# Patient Record
Sex: Female | Born: 1945 | Race: Black or African American | Hispanic: No | Marital: Married | Smoking: Never smoker
Health system: Southern US, Community
[De-identification: ages and names within clinical notes are randomized; demographics above are authoritative.]

## PROBLEM LIST (undated history)

## (undated) HISTORY — PX: BREAST CYST EXCISION: SHX579

---

## 2014-02-09 ENCOUNTER — Other Ambulatory Visit: Payer: Self-pay

## 2014-02-09 DIAGNOSIS — Z1231 Encounter for screening mammogram for malignant neoplasm of breast: Secondary | ICD-10-CM

## 2014-02-24 ENCOUNTER — Ambulatory Visit
Admission: RE | Admit: 2014-02-24 | Discharge: 2014-02-24 | Disposition: A | Discharge status: Home / Self Care | Payer: Medicare Other | Source: Ambulatory Visit

## 2014-02-24 ENCOUNTER — Encounter (INDEPENDENT_AMBULATORY_CARE_PROVIDER_SITE_OTHER): Payer: Self-pay

## 2014-02-24 DIAGNOSIS — Z1231 Encounter for screening mammogram for malignant neoplasm of breast: Secondary | ICD-10-CM

## 2015-02-21 ENCOUNTER — Other Ambulatory Visit: Payer: Self-pay

## 2015-02-21 DIAGNOSIS — Z1231 Encounter for screening mammogram for malignant neoplasm of breast: Secondary | ICD-10-CM

## 2015-03-30 ENCOUNTER — Ambulatory Visit
Admission: RE | Admit: 2015-03-30 | Discharge: 2015-03-30 | Disposition: A | Discharge status: Home / Self Care | Payer: Medicare HMO | Source: Ambulatory Visit

## 2015-03-30 DIAGNOSIS — Z1231 Encounter for screening mammogram for malignant neoplasm of breast: Secondary | ICD-10-CM

## 2015-04-03 ENCOUNTER — Other Ambulatory Visit: Payer: Self-pay | Admitting: Family Medicine

## 2015-04-03 DIAGNOSIS — R928 Other abnormal and inconclusive findings on diagnostic imaging of breast: Secondary | ICD-10-CM

## 2015-04-21 ENCOUNTER — Ambulatory Visit
Admission: RE | Admit: 2015-04-21 | Discharge: 2015-04-21 | Disposition: A | Discharge status: Home / Self Care | Payer: Medicare HMO | Source: Ambulatory Visit | Attending: Family Medicine | Admitting: Family Medicine

## 2015-04-21 DIAGNOSIS — R928 Other abnormal and inconclusive findings on diagnostic imaging of breast: Secondary | ICD-10-CM

## 2015-07-14 ENCOUNTER — Ambulatory Visit: Payer: Medicare HMO | Admitting: Physical Therapy

## 2015-07-21 ENCOUNTER — Ambulatory Visit: Payer: Medicare HMO | Admitting: Physical Therapy

## 2015-08-24 ENCOUNTER — Ambulatory Visit: Payer: Medicare HMO | Attending: Family Medicine | Admitting: Physical Therapy

## 2015-08-24 ENCOUNTER — Encounter: Payer: Self-pay | Admitting: Physical Therapy

## 2015-08-24 DIAGNOSIS — M545 Low back pain, unspecified: Secondary | ICD-10-CM

## 2015-08-24 NOTE — Therapy (Addendum)
[place], [place], [address] Phone: [phone]   Fax:  [phone]  Physical Therapy Evaluation  Patient Details  Name: Melissa Hull MRN: [id number] Date of Birth: [birth year] Referring Provider: Suzanna Obey  Encounter Date: 08/24/2015      PT End of Session - 08/24/15 1326    Visit Number 1   Date for PT Re-Evaluation 10/24/15   PT Start Time 1301   PT Stop Time 1355   PT Time Calculation (min) 54 min   Activity Tolerance Patient tolerated treatment well   Behavior During Therapy [facility] for tasks assessed/performed      History reviewed. No pertinent past medical history.  History reviewed. No pertinent past surgical history.  There were no vitals filed for this visit.       Subjective Assessment - 08/24/15 1301    Subjective Patient reports that she has had a long standing LBP.  She reports that she has seen a chiropractor in the past with good results.  She reports that she has had some increase of LBP recently, no known cause.     Limitations Lifting;Sitting  limited sleeping   Patient Stated Goals have less pain   Currently in Pain? Yes   Pain Score 3    Pain Location Back   Pain Orientation Lower;Left   Pain Descriptors / Indicators Aching   Pain Type Chronic pain   Pain Onset More than a month ago   Pain Frequency Constant   Aggravating Factors  lifting, standing can increase pain up to 8/10   Pain Relieving Factors rest, Tylenol and Motrin   Effect of Pain on Daily Activities limits a lot of ADL's. reports that she just cannot do what she was able to do            [facility] PT Assessment - 08/24/15 0001    Assessment   Medical Diagnosis LBP   Referring Provider Suzanna Obey   Onset Date/Surgical Date 07/25/15   Prior Therapy chiropractor   Precautions   Precautions None   Balance Screen   Has the patient fallen in the past 6 months No   Has the patient had a  decrease in activity level because of a fear of falling?  No   Is the patient reluctant to leave their home because of a fear of falling?  No   Home Environment   Additional Comments does housework   Prior Function   Level of Independence Independent   Vocation Retired   Leisure does not exercise   Mining engineer Comments fwd head   ROM / Strength   AROM / PROM / Strength AROM;Strength   AROM   Overall AROM Comments Lumbar ROM decreased 25% with some "stiffness"   Strength   Overall Strength Comments 4-/5 for the LE's   Flexibility   Soft Tissue Assessment /Muscle Length --  tightness in the calves, HS and piriformis   Palpation   Palpation comment patient with tenderness and tightness in the lumbar paraspinals and into the buttocks                   [facility] Adult PT Treatment/Exercise - 08/24/15 0001    Modalities   Modalities Electrical Stimulation;Moist Heat   Moist Heat Therapy   Number Minutes Moist Heat 15 Minutes   Moist Heat Location Lumbar Spine   Electrical Stimulation   Electrical Stimulation Location low back  Electrical Stimulation Action IFC   Electrical Stimulation Parameters sitting   Electrical Stimulation Goals Pain                PT Education - 08/24/15 1326    Education provided Yes   Education Details Wms flexion exercises, HS and piriformis stretches   Person(s) Educated Patient   Methods Explanation;Demonstration;Handout   Comprehension Verbalized understanding          PT Short Term Goals - 08/24/15 1330    PT SHORT TERM GOAL #1   Title independent with intial HEP   Time 2   Period Weeks   Status New           PT Long Term Goals - 08/24/15 1330    PT LONG TERM GOAL #1   Title decrease pain 50%   Time 8   Period Weeks   Status New   PT LONG TERM GOAL #2   Title increase lumbar ROM 25%   Time 8   Period Weeks   Status New   PT LONG TERM GOAL #3   Title understand proper posture and body  mechanics   Time 8   Period Weeks   Status New   PT LONG TERM GOAL #4   Title increase LE strength to 4/5   Time 8   Period Weeks   Status New   PT LONG TERM GOAL #5   Title report sleep better per her by 25%   Time 8   Period Weeks   Status New               Plan - 08/24/15 1328    Clinical Impression Statement Patient with chronic LBP, had recent x-rays but has no results yet.  She reports that she has benefitted from chiropractic treatment in the past.  She is tight in the calves, HS and piriformis mms, her LE's are weak   Rehab Potential Good   PT Frequency 2x / week   PT Duration 8 weeks   PT Treatment/Interventions Moist Heat;Therapeutic exercise;Therapeutic activities;Ultrasound;Traction;ADLs/Self Care Home Management;Patient/family education;Manual techniques;Passive range of motion   PT Next Visit Plan slowly add exercises for core strength   Consulted and Agree with Plan of Care Patient      Patient will benefit from skilled therapeutic intervention in order to improve the following deficits and impairments:  Decreased range of motion, Decreased strength, Difficulty walking, Increased fascial restricitons, Increased muscle spasms, Impaired flexibility, Postural dysfunction, Improper body mechanics, Pain  Visit Diagnosis: Bilateral low back pain without sciatica - Plan: PT plan of care cert/re-cert      G-Codes - 78/29/56 1333    Functional Assessment Tool Used foto 54% limitation   Functional Limitation Other PT primary   Other PT Primary Current Status (O1308) At least 40 percent but less than 60 percent impaired, limited or restricted   Other PT Primary Goal Status (M5784) At least 40 percent but less than 60 percent impaired, limited or restricted     PHYSICAL THERAPY DISCHARGE SUMMARY  Visits from Start of Care: 1   Plan: Patient agrees to discharge.  Patient goals were not met. Patient is being discharged due to not returning since the last visit.   ?????        Problem List There are no active problems to display for this patient.   Sumner Boast., PT 08/24/2015, 1:43 PM  [place], [place], [address]  Phone: [phone]   Fax:  [phone]  Name: Melissa Hull MRN: [id number] Date of Birth: 05-12-1945

## 2015-08-31 ENCOUNTER — Ambulatory Visit: Payer: Medicare HMO | Admitting: Physical Therapy

## 2015-09-05 ENCOUNTER — Ambulatory Visit: Payer: Medicare HMO | Admitting: Physical Therapy

## 2015-09-12 ENCOUNTER — Other Ambulatory Visit: Payer: Self-pay | Admitting: Family Medicine

## 2015-09-12 DIAGNOSIS — N6310 Unspecified lump in the right breast, unspecified quadrant: Secondary | ICD-10-CM

## 2015-10-20 ENCOUNTER — Other Ambulatory Visit: Payer: Medicare HMO

## 2015-10-24 ENCOUNTER — Other Ambulatory Visit: Payer: Medicare HMO

## 2015-11-01 ENCOUNTER — Ambulatory Visit
Admission: RE | Admit: 2015-11-01 | Discharge: 2015-11-01 | Disposition: A | Discharge status: Home / Self Care | Payer: Medicare HMO | Source: Ambulatory Visit | Attending: Family Medicine | Admitting: Family Medicine

## 2015-11-01 DIAGNOSIS — N6310 Unspecified lump in the right breast, unspecified quadrant: Secondary | ICD-10-CM

## 2016-04-26 ENCOUNTER — Other Ambulatory Visit: Payer: Self-pay | Admitting: Family Medicine

## 2016-04-26 DIAGNOSIS — N6310 Unspecified lump in the right breast, unspecified quadrant: Secondary | ICD-10-CM

## 2016-08-22 ENCOUNTER — Ambulatory Visit: Payer: Medicare HMO | Admitting: Physical Therapy

## 2017-01-07 ENCOUNTER — Ambulatory Visit
Admission: RE | Admit: 2017-01-07 | Discharge: 2017-01-07 | Disposition: A | Discharge status: Home / Self Care | Payer: Medicare HMO | Source: Ambulatory Visit | Attending: Family Medicine | Admitting: Family Medicine

## 2017-01-07 DIAGNOSIS — N6310 Unspecified lump in the right breast, unspecified quadrant: Secondary | ICD-10-CM

## 2017-12-09 ENCOUNTER — Other Ambulatory Visit: Payer: Self-pay | Admitting: Family Medicine

## 2017-12-09 DIAGNOSIS — Z1231 Encounter for screening mammogram for malignant neoplasm of breast: Secondary | ICD-10-CM

## 2018-01-08 ENCOUNTER — Ambulatory Visit
Admission: RE | Admit: 2018-01-08 | Discharge: 2018-01-08 | Disposition: A | Discharge status: Home / Self Care | Payer: Medicare HMO | Source: Ambulatory Visit | Attending: Family Medicine | Admitting: Family Medicine

## 2018-01-08 DIAGNOSIS — Z1231 Encounter for screening mammogram for malignant neoplasm of breast: Secondary | ICD-10-CM

## 2019-04-06 ENCOUNTER — Other Ambulatory Visit: Payer: Self-pay | Admitting: Family Medicine

## 2019-04-06 DIAGNOSIS — Z1231 Encounter for screening mammogram for malignant neoplasm of breast: Secondary | ICD-10-CM

## 2019-05-06 ENCOUNTER — Ambulatory Visit: Payer: Medicare HMO | Attending: Internal Medicine

## 2019-05-06 DIAGNOSIS — Z23 Encounter for immunization: Secondary | ICD-10-CM | POA: Insufficient documentation

## 2019-05-06 NOTE — Progress Notes (Signed)
   Covid-19 Vaccination Clinic  Name:  VIKTORIA GRUETZMACHER    MRN: [id number] DOB: November 10, 1945  05/06/2019  Ms. Gigante was observed post Covid-19 immunization for 15 minutes without incidence. She was provided with Vaccine Information Sheet and instruction to access the V-Safe system.   Ms. Segoviano was instructed to call 911 with any severe reactions post vaccine: Marland Kitchen Difficulty breathing  . Swelling of your face and throat  . A fast heartbeat  . A bad rash all over your body  . Dizziness and weakness    Immunizations Administered    Name Date Dose VIS Date Route   Pfizer COVID-19 Vaccine 05/06/2019 10:26 AM 0.3 mL 03/26/2019 Intramuscular   Manufacturer: [employer], [employer]   Lot: [id number]   NDC: [address]-5

## 2019-05-21 ENCOUNTER — Other Ambulatory Visit: Payer: Self-pay

## 2019-05-21 ENCOUNTER — Ambulatory Visit
Admission: RE | Admit: 2019-05-21 | Discharge: 2019-05-21 | Disposition: A | Discharge status: Home / Self Care | Payer: Medicare HMO | Source: Ambulatory Visit | Attending: Family Medicine | Admitting: Family Medicine

## 2019-05-21 DIAGNOSIS — Z1231 Encounter for screening mammogram for malignant neoplasm of breast: Secondary | ICD-10-CM

## 2019-05-27 ENCOUNTER — Ambulatory Visit: Payer: Medicare HMO | Attending: Internal Medicine

## 2019-05-27 DIAGNOSIS — Z23 Encounter for immunization: Secondary | ICD-10-CM

## 2019-05-27 NOTE — Progress Notes (Signed)
   Covid-19 Vaccination Clinic  Name:  Melissa Hull    MRN: [id number] DOB: [birth year]  05/27/2019  [name] was observed post Covid-19 immunization for 15 minutes without incidence. She was provided with Vaccine Information Sheet and instruction to access the V-Safe system.   [name] was instructed to call 911 with any severe reactions post vaccine: Marland Kitchen Difficulty breathing  . Swelling of your face and throat  . A fast heartbeat  . A bad rash all over your body  . Dizziness and weakness    Immunizations Administered    Name Date Dose VIS Date Route   Pfizer COVID-19 Vaccine 05/27/2019 10:42 AM 0.3 mL 03/26/2019 Intramuscular   Manufacturer: [employer], [employer]   Lot: [id number]   NDC: [address]-5

## 2019-09-25 IMAGING — MG DIGITAL SCREENING BILATERAL MAMMOGRAM WITH TOMO AND CAD
8 of 14 series · 8 of 40 positions shown · non-contrast
Comparison: Previous exam(s).

CLINICAL DATA: Screening.

EXAM:
DIGITAL SCREENING BILATERAL MAMMOGRAM WITH TOMO AND CAD

[L MLO synth-2D (1 of 2)]
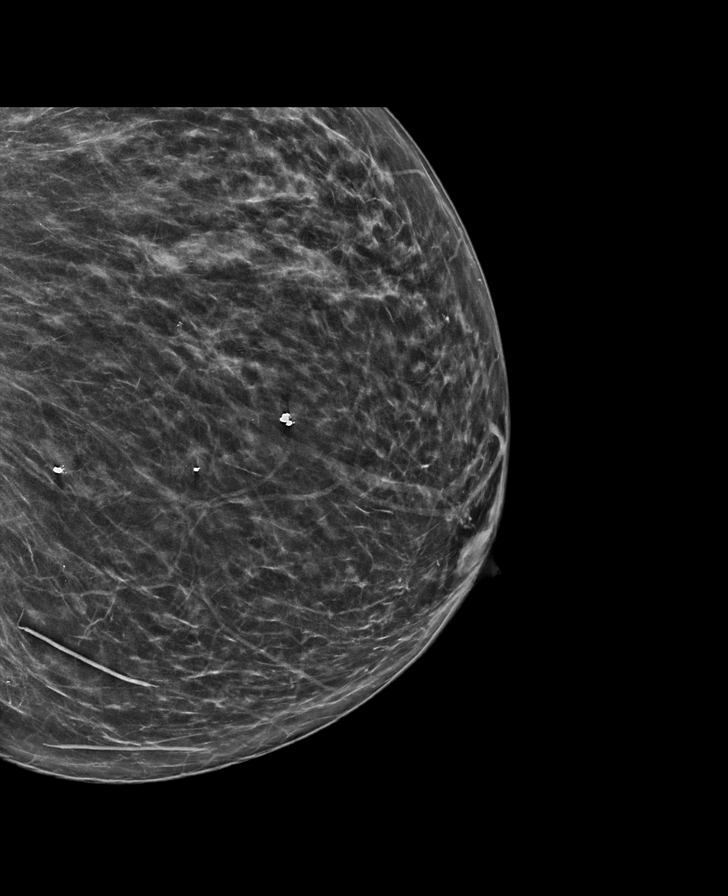

[R MLO synth-2D (1 of 2)]
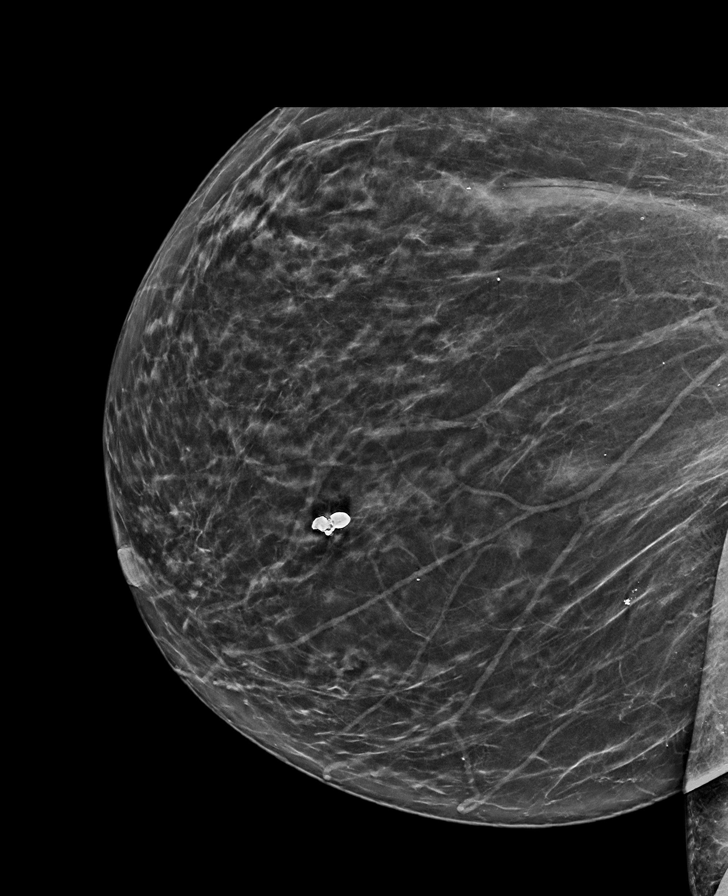

[R MLO synth-2D (2 of 2)]
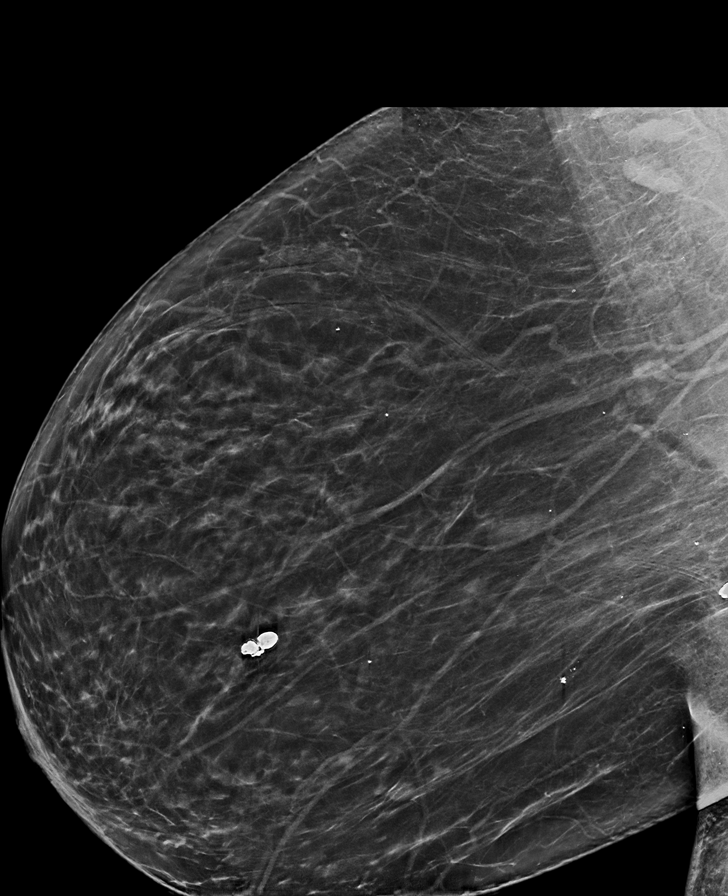

[L CC synth-2D (1 of 2)]
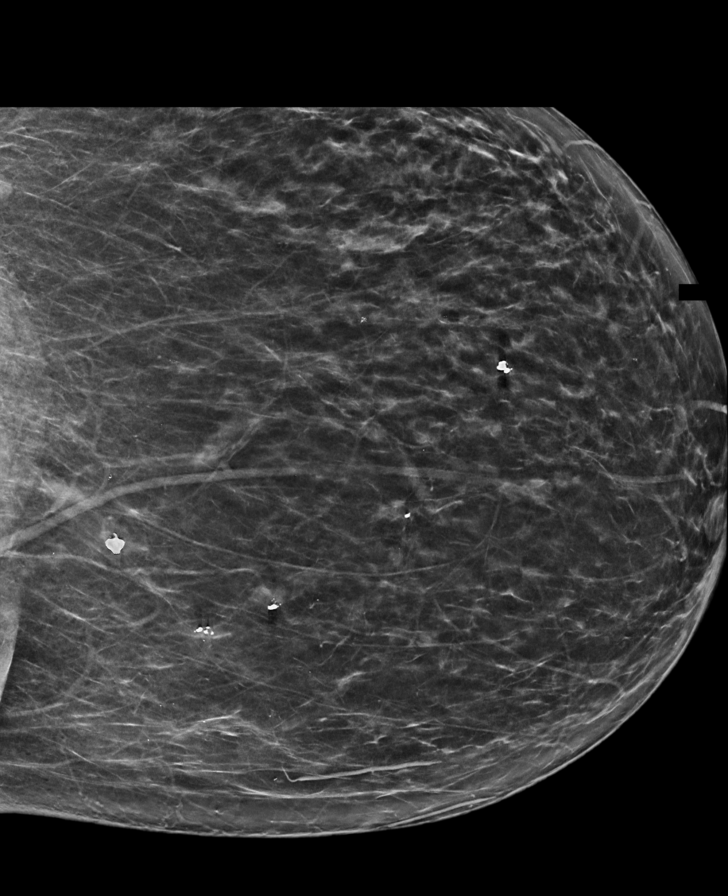

[L CC synth-2D (2 of 2)]
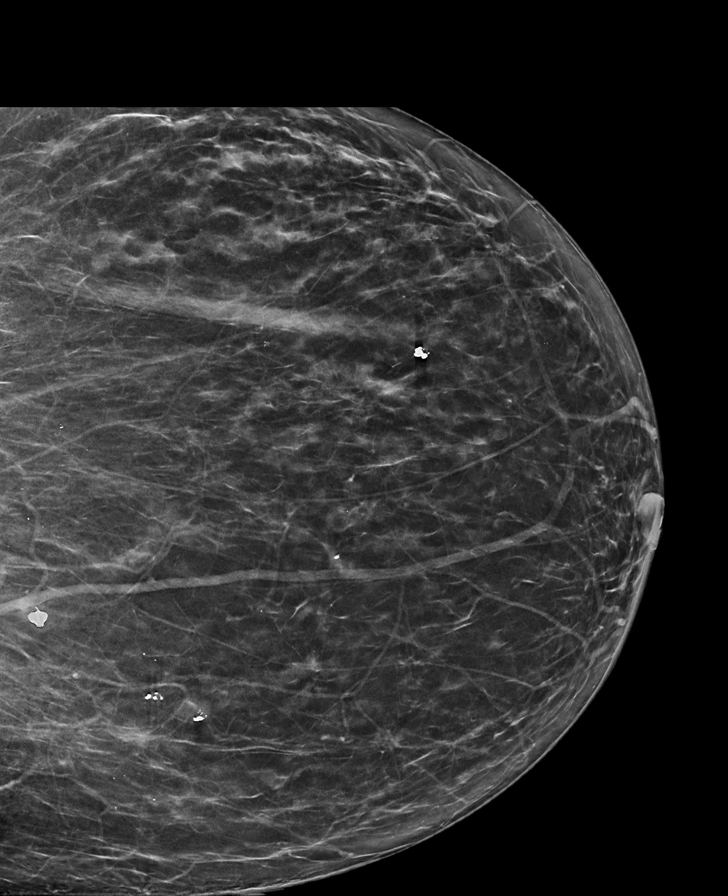

[L MLO synth-2D (2 of 2)]
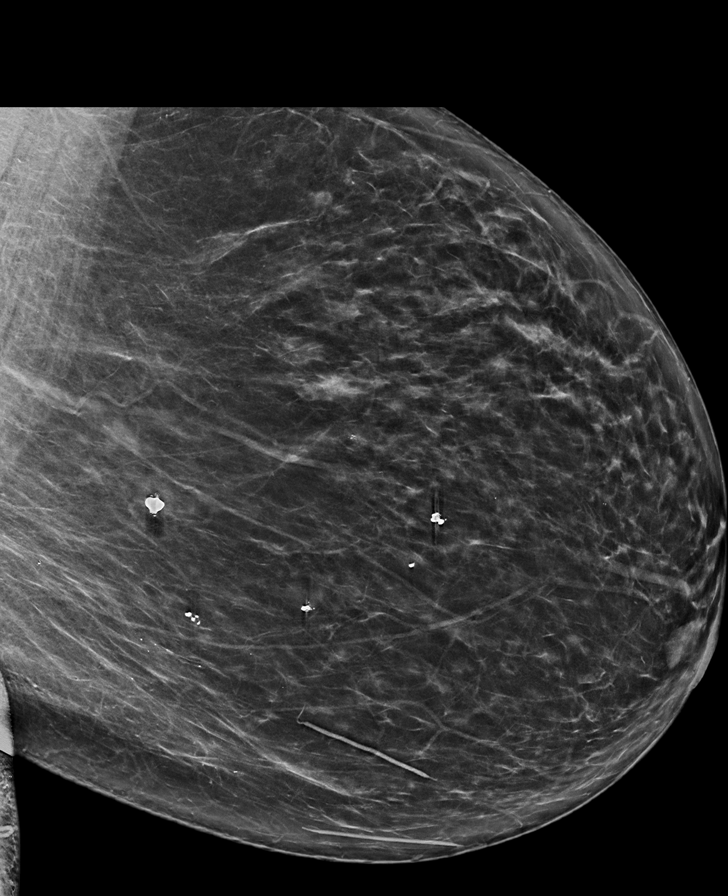

[R CC synth-2D]
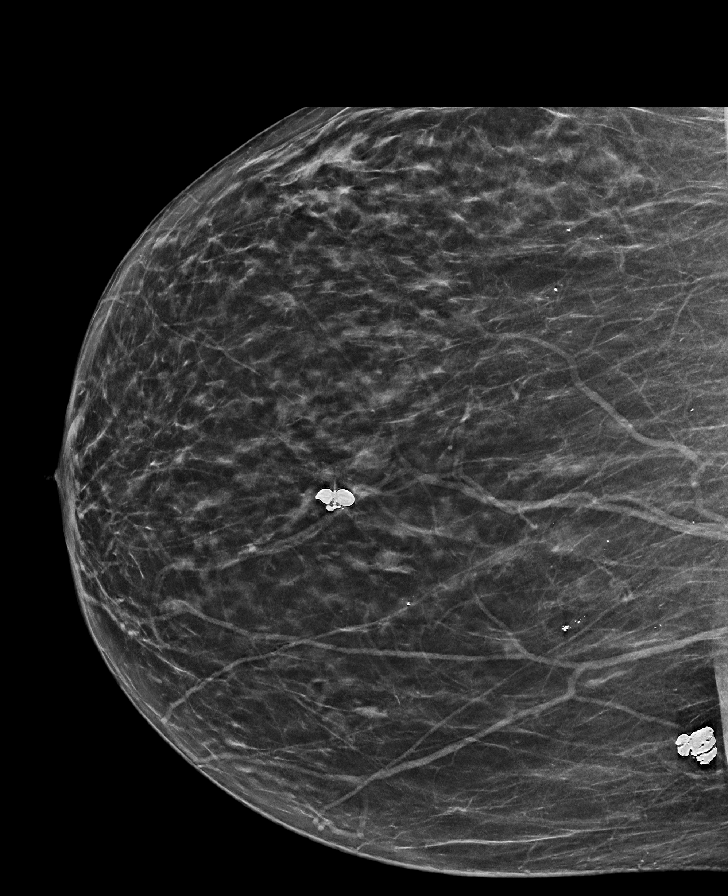

[R MLO tomo · tomo slice 37/73.0]
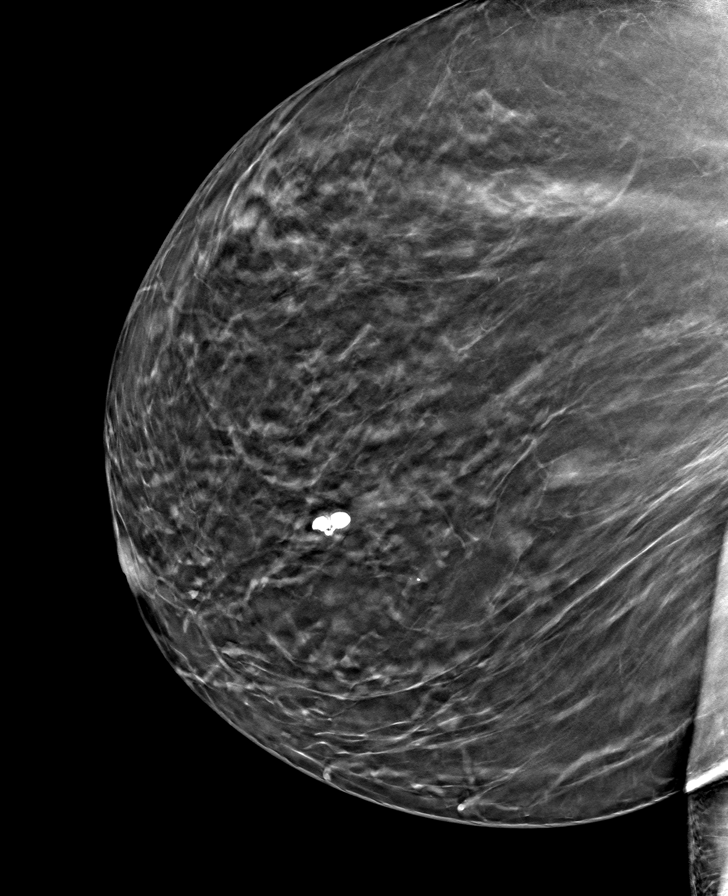

[8 of 40 positions shown; findings below may reference images not displayed]

ACR Breast Density Category b: There are scattered areas of
fibroglandular density.
FINDINGS: There are no findings suspicious for malignancy. Images were
processed with CAD.
IMPRESSION: No mammographic evidence of malignancy. A result letter of this
screening mammogram will be mailed directly to the patient.

RECOMMENDATION:
Screening mammogram in one year. (Code:[id number])

BI-RADS CATEGORY  1: Negative.

## 2019-12-28 ENCOUNTER — Ambulatory Visit: Payer: Medicare HMO | Attending: Internal Medicine

## 2019-12-28 DIAGNOSIS — Z23 Encounter for immunization: Secondary | ICD-10-CM

## 2019-12-28 NOTE — Progress Notes (Signed)
   Covid-19 Vaccination Clinic  Name:  KEIANDRA SULLENGER    MRN: [id number] DOB: Nov 16, 1945  12/28/2019  Ms. Netherland was observed post Covid-19 immunization for 15 minutes without incident. She was provided with Vaccine Information Sheet and instruction to access the V-Safe system.   Ms. Corkern was instructed to call 911 with any severe reactions post vaccine: Marland Kitchen Difficulty breathing  . Swelling of face and throat  . A fast heartbeat  . A bad rash all over body  . Dizziness and weakness

## 2020-04-19 ENCOUNTER — Other Ambulatory Visit: Payer: Self-pay | Admitting: Family Medicine

## 2020-04-19 DIAGNOSIS — R5381 Other malaise: Secondary | ICD-10-CM

## 2020-05-09 ENCOUNTER — Other Ambulatory Visit: Payer: Self-pay | Admitting: Family Medicine

## 2020-05-09 DIAGNOSIS — N644 Mastodynia: Secondary | ICD-10-CM

## 2020-06-14 ENCOUNTER — Ambulatory Visit: Payer: Medicare HMO

## 2020-06-14 ENCOUNTER — Ambulatory Visit
Admission: RE | Admit: 2020-06-14 | Discharge: 2020-06-14 | Disposition: A | Discharge status: Home / Self Care | Payer: Medicare HMO | Source: Ambulatory Visit | Attending: Family Medicine | Admitting: Family Medicine

## 2020-06-14 ENCOUNTER — Other Ambulatory Visit: Payer: Self-pay

## 2020-06-14 DIAGNOSIS — N644 Mastodynia: Secondary | ICD-10-CM

## 2020-07-26 ENCOUNTER — Encounter (INDEPENDENT_AMBULATORY_CARE_PROVIDER_SITE_OTHER): Payer: Self-pay | Admitting: Otolaryngology

## 2020-07-26 ENCOUNTER — Ambulatory Visit (INDEPENDENT_AMBULATORY_CARE_PROVIDER_SITE_OTHER): Payer: Medicare HMO | Admitting: Otolaryngology

## 2020-07-26 ENCOUNTER — Other Ambulatory Visit: Payer: Self-pay

## 2020-07-26 VITALS — Temp 98.1°F

## 2020-07-26 DIAGNOSIS — H903 Sensorineural hearing loss, bilateral: Secondary | ICD-10-CM | POA: Diagnosis not present

## 2020-07-26 NOTE — Progress Notes (Signed)
HPI: Melissa Hull is a 75 y.o. female who presents for evaluation of wax in her ears.  She also complains of muffled hearing.  She presents to have her ears cleaned.  She presents with her husband with similar complaints.  No past medical history on file. Past Surgical History:  Procedure Laterality Date  . BREAST CYST EXCISION Left pt unsure   x 2    Social History   Socioeconomic History  . Marital status: Married    Spouse name: Not on file  . Number of children: Not on file  . Years of education: Not on file  . Highest education level: Not on file  Occupational History  . Not on file  Tobacco Use  . Smoking status: Never Smoker  . Smokeless tobacco: Never Used  Substance and Sexual Activity  . Alcohol use: Not on file  . Drug use: Not on file  . Sexual activity: Not on file  Other Topics Concern  . Not on file  Social History Narrative  . Not on file   Social Determinants of Health   Financial Resource Strain: Not on file  Food Insecurity: Not on file  Transportation Needs: Not on file  Physical Activity: Not on file  Stress: Not on file  Social Connections: Not on file   Family History  Problem Relation Age of Onset  . Breast cancer Neg Hx    Not on File Prior to Admission medications   Not on File     Positive ROS: Otherwise negative  All other systems have been reviewed and were otherwise negative with the exception of those mentioned in the HPI and as above.  Physical Exam: Constitutional: Alert, well-appearing, no acute distress Ears: External ears without lesions or tenderness. Ear canals had minimal wax buildup in both ear canals that was nonobstructing.  This was cleaned with curettes.  Ear canals and TMs are otherwise clear bilaterally.  On hearing screening with the 1024 tuning fork she had a mild diminished hearing in both ears that was symmetric. Nasal: External nose without lesions. Septum midline with mild rhinitis. Clear nasal passages  otherwise. Oral: Lips and gums without lesions. Tongue and palate mucosa without lesions. Posterior oropharynx clear. Neck: No palpable adenopathy or masses Respiratory: Breathing comfortably  Skin: No facial/neck lesions or rash noted.  Procedures  Assessment: Minimal wax buildup that was nonobstructing in her ears. 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  Plan: Recommended obtaining audiologic testing to evaluate hearing and use of hearing aids if she has any substantial hearing loss.  Narda Bonds, MD

## 2021-02-06 ENCOUNTER — Telehealth: Payer: Self-pay | Admitting: *Deleted

## 2021-02-06 NOTE — Telephone Encounter (Signed)
Copied from CRM [id number]. Topic: General - Other >> Feb 02, 2021 12:52 PM Traci Sermon wrote: Reason for CRM: Pt called in wanting to know about the expired medication drop off box, and asked if someone could give her a call about what she can do, please advise.

## 2021-02-07 NOTE — Telephone Encounter (Signed)
Yes ma'am if she's referring to our container out front in the lobby, she is more than welcome to drop off expired medications as long as they are oral tabs. We can't accept inhalers, pens, or needles. I called and left her a VM explaining this.

## 2021-07-30 ENCOUNTER — Other Ambulatory Visit: Payer: Self-pay | Admitting: Family Medicine

## 2021-07-30 DIAGNOSIS — Z1231 Encounter for screening mammogram for malignant neoplasm of breast: Secondary | ICD-10-CM

## 2021-08-15 ENCOUNTER — Ambulatory Visit
Admission: RE | Admit: 2021-08-15 | Discharge: 2021-08-15 | Disposition: A | Discharge status: Home / Self Care | Payer: Medicare HMO | Source: Ambulatory Visit | Attending: Family Medicine | Admitting: Family Medicine

## 2021-08-15 DIAGNOSIS — Z1231 Encounter for screening mammogram for malignant neoplasm of breast: Secondary | ICD-10-CM

## 2022-09-20 ENCOUNTER — Other Ambulatory Visit: Payer: Self-pay | Admitting: Family Medicine

## 2022-09-20 DIAGNOSIS — Z1231 Encounter for screening mammogram for malignant neoplasm of breast: Secondary | ICD-10-CM

## 2022-09-24 ENCOUNTER — Ambulatory Visit: Payer: Medicare HMO

## 2022-10-22 ENCOUNTER — Ambulatory Visit: Payer: Medicare HMO

## 2022-10-24 ENCOUNTER — Ambulatory Visit
Admission: RE | Admit: 2022-10-24 | Discharge: 2022-10-24 | Disposition: A | Discharge status: Home / Self Care | Payer: Medicare HMO | Source: Ambulatory Visit | Attending: Family Medicine | Admitting: Family Medicine

## 2022-10-24 DIAGNOSIS — Z1231 Encounter for screening mammogram for malignant neoplasm of breast: Secondary | ICD-10-CM

## 2023-12-04 ENCOUNTER — Other Ambulatory Visit: Payer: Self-pay | Admitting: Family Medicine

## 2023-12-04 DIAGNOSIS — Z1231 Encounter for screening mammogram for malignant neoplasm of breast: Secondary | ICD-10-CM

## 2023-12-31 ENCOUNTER — Ambulatory Visit
Admission: RE | Admit: 2023-12-31 | Discharge: 2023-12-31 | Disposition: A | Discharge status: Home / Self Care | Source: Ambulatory Visit | Attending: Family Medicine | Admitting: Family Medicine

## 2023-12-31 DIAGNOSIS — Z1231 Encounter for screening mammogram for malignant neoplasm of breast: Secondary | ICD-10-CM
